# Patient Record
Sex: Female | Born: 2000 | Race: White | Hispanic: No | Marital: Single | Smoking: Never smoker
Health system: Southern US, Community
[De-identification: ages and names within clinical notes are randomized; demographics above are authoritative.]

## PROBLEM LIST (undated history)

## (undated) DIAGNOSIS — J309 Allergic rhinitis, unspecified: Secondary | ICD-10-CM

## (undated) HISTORY — PX: EXTERNAL EAR SURGERY: SHX627

## (undated) HISTORY — DX: Allergic rhinitis, unspecified: J30.9

---
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
## 2022-08-19 ENCOUNTER — Other Ambulatory Visit: Payer: Self-pay | Admitting: Gastroenterology

## 2022-09-11 ENCOUNTER — Other Ambulatory Visit: Payer: Self-pay | Admitting: Gastroenterology

## 2023-01-21 ENCOUNTER — Other Ambulatory Visit: Payer: Self-pay | Admitting: Gastroenterology

## 2023-02-04 ENCOUNTER — Ambulatory Visit: Payer: BC Managed Care – PPO | Admitting: Allergy & Immunology

## 2023-02-04 ENCOUNTER — Encounter: Payer: Self-pay | Admitting: Allergy & Immunology

## 2023-02-04 ENCOUNTER — Other Ambulatory Visit: Payer: Self-pay

## 2023-02-04 VITALS — BP 110/80 | HR 66 | Temp 98.4°F | Resp 16 | Ht 68.31 in | Wt 154.8 lb

## 2023-02-04 DIAGNOSIS — J3089 Other allergic rhinitis: Secondary | ICD-10-CM | POA: Diagnosis not present

## 2023-02-04 DIAGNOSIS — R1084 Generalized abdominal pain: Secondary | ICD-10-CM | POA: Diagnosis not present

## 2023-02-04 DIAGNOSIS — K219 Gastro-esophageal reflux disease without esophagitis: Secondary | ICD-10-CM

## 2023-02-04 DIAGNOSIS — L2089 Other atopic dermatitis: Secondary | ICD-10-CM

## 2023-02-04 DIAGNOSIS — J302 Other seasonal allergic rhinitis: Secondary | ICD-10-CM

## 2023-02-04 NOTE — Patient Instructions (Addendum)
1. Seasonal and perennial allergic rhinitis (grasses, trees and cat) - Continue with Claritin daily as you are doing.  - We can do allergy testing via the blood today to see if anything new pops up.   2. Flexural atopic dermatitis - Continue with moisturizing as you are doing. - It seems that they are under good control.   3. Generalized abdominal pain (with possible food allergies) - We will check for ginger, watermelon, and egg.   4. Return in about 1 year (around 02/04/2024). You can have the follow up appointment with Dr. Dellis Anes or a Nurse Practicioner (our Nurse Practitioners are excellent and always have Physician oversight!).    Please inform us of any Emergency Department visits, hospitalizations, or changes in symptoms. Call us before going to the ED for breathing or allergy symptoms since we might be able to fit you in for a sick visit. Feel free to contact us anytime with any questions, problems, or concerns.  It was a pleasure to see you guys again today!  Websites that have reliable patient information: 1. American Academy of Asthma, Allergy, and Immunology: www.aaaai.org 2. Food Allergy Research and Education (FARE): foodallergy.org 3. Mothers of Asthmatics: http://www.asthmacommunitynetwork.org 4. American College of Allergy, Asthma, and Immunology: www.acaai.org      "Like" [place] on Facebook and Instagram for our latest updates!      A healthy democracy works best when [employer] participate! Make sure you are registered to vote! If you have moved or changed any of your contact information, you will need to get this updated before voting! Scan the QR codes below to learn more!

## 2023-02-04 NOTE — Progress Notes (Signed)
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 Seasonal and perennial allergic rhinitis (grasses, trees and cat) - Continue with Claritin daily as you are doing.  - We can do allergy testing via the blood today to see if anything new pops up.   2. Flexural atopic dermatitis - Continue with moisturizing as you are doing. - It seems that they are under good control.   3. Generalized abdominal pain (with possible food allergies) - We will check for ginger, watermelon, and egg.   4. Return in about 1 year (around 02/04/2024). You can have the follow up appointment with Dr. Dellis Anes or a Nurse Practicioner (our Nurse Practitioners are excellent and always have Physician oversight!).   This note in its entirety was forwarded to the Provider who requested this consultation.  Subjective:   Julia Weber is a 22 y.o. female presenting today for evaluation of  Chief Complaint  Patient presents with   Follow-up    Want to be retesting for allergies, but patient has been an antihistamines.     Julia Weber has a history of the following: Patient Active Problem List   Diagnosis Date Noted   Seasonal and perennial allergic rhinitis 08/05/2019   Insect sting allergy 08/05/2019   Flexural atopic dermatitis 08/05/2019   Food intolerance 08/05/2019    History obtained from: chart review and patient and mother.  Discussed the use of AI scribe software for clinical note transcription with the patient and/or guardian, who gave verbal consent to proceed.  Julia Weber was  referred by Pa, [facility].     Julia Weber is a 22 y.o. female presenting for an evaluation of food and environmental allergens . She was last seen in June 2021. At that time, she had testing that was positive to grasses, trees, and cat. She also had some food testing that was positive to ginger and watermelon, but she was tolerating these without a problem. I felt that these were sensitizations instead. She also had a stinging insect panel ordered that was negative. Egg testing was essentially negative with an IgE of 0.11 to ovalbumin.   Since the last visit, she has generally done well. She is home from school and they wanted to come in to get re-established.   Allergic Rhinitis Symptom History: Julia Weber has a history of environmental allergies, which are reportedly well-controlled but tend to exacerbate in the fall and spring. She had a recent episode of sinus issues, for which she was prescribed antibiotics. She was also advised to switch from Claritin to Xyzal, but she developed hoarseness, which she attributed to the new medication, and subsequently reverted back to Claritin.  Food Allergy Symptom History: Julia Weber, with a known history of food allergies, specifically to watermelon, ginger, and eggs, presents for retesting after a period of three and a half years. She has been strictly avoiding these allergens since her last visit and has not experienced any adverse reactions. The patient also reports a history of stomach pain associated with certain foods, which has improved since avoiding the identified allergens.  She has been managing any residual stomach discomfort with dicyclomine, which she reports as effective. She has IBS which was diagnosed after we saw her in 2021.   Skin Symptom History: Julia Weber has a history of eczema, which was previously mild and is currently well-controlled.   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 She is considering transferring to a different school due to dissatisfaction with her current situation.  Otherwise, there is no history of other atopic diseases, including asthma, food allergies, drug allergies, stinging insect allergies, or contact dermatitis. There is no significant infectious history. Vaccinations are up to date.    Past Medical History: Patient Active Problem List   Diagnosis Date Noted   Seasonal and perennial allergic rhinitis 08/05/2019   Insect sting allergy 08/05/2019   Flexural atopic dermatitis 08/05/2019   Food intolerance 08/05/2019    Medication List:  Allergies as of 02/04/2023       Reactions   Ginger    Watermelon [citrullus Vulgaris]    Per allergist office note        Medication List        Accurate as of February 04, 2023  1:06 PM. If you have any questions, ask your nurse or doctor.          STOP taking these medications    IBgard 90 MG Cpcr Generic drug: Peppermint Oil Stopped by: Alfonse Spruce   ondansetron 4 MG disintegrating tablet Commonly known as: ZOFRAN-ODT Stopped by: Alfonse Spruce       TAKE these medications    clindamycin 1 % gel Commonly known as: CLINDAGEL Apply 1 Application topically as directed.   dicyclomine 10 MG capsule Commonly known as: BENTYL Take 1 capsule (10 mg total) by mouth every 8 (eight) hours as needed for spasms. Please schedule a yearly follow up for further refills. Thank you   loratadine 10 MG tablet Commonly known as: CLARITIN Take 10 mg by mouth daily.   norgestimate-ethinyl estradiol 0.25-35 MG-MCG tablet Commonly known as: ORTHO-CYCLEN Take 1 tablet by mouth daily.   omeprazole 20 MG capsule Commonly known as: PRILOSEC Take 1 capsule by mouth daily.   tretinoin 0.05 % cream Commonly known as: RETIN-A Apply 1 Application topically as needed.   triamcinolone 55 MCG/ACT Aero nasal inhaler Commonly known as: NASACORT Place into  the nose.        Birth History: non-contributory  Developmental History: non-contributory  Past Surgical History: Past Surgical History:  Procedure Laterality Date   EXTERNAL EAR SURGERY       Family History: Family History  Problem Relation Age of Onset   Allergic rhinitis Mother    Cancer Maternal Grandmother        mouth   Colon cancer Paternal Grandmother    Stomach cancer Neg Hx    Esophageal cancer Neg Hx      Social History: Victora lives at home with her mother. They live in a house that is 32 years old. There is carpeting throughout the home. She has gas heating and electric and central cooling. 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 There are no dust mite coverings in the home. They have no cigarette smoking exposure. They do have a HEPA filter in the home. They do not live near an interstate or industrial area.    Review of systems otherwise negative other than that mentioned in the HPI.    Objective:   Blood pressure 110/80, pulse  66, temperature 98.4 F (36.9 C), temperature source Temporal, resp. rate 16, height 5' 8.31" (1.735 m), weight 154 lb 12.8 oz (70.2 kg), SpO2 100%. Body mass index is 23.33 kg/m.     Physical Exam Vitals reviewed.  Constitutional:      Appearance: She is well-developed.     Comments: Very friendly.   HENT:     Head: Normocephalic and atraumatic.     Right Ear: Tympanic membrane, ear canal and external ear normal. No drainage, swelling or tenderness. Tympanic membrane is not injected, scarred, erythematous, retracted or bulging.     Left Ear: Tympanic membrane, ear canal and external ear normal. No drainage, swelling or tenderness. Tympanic membrane is not injected, scarred, erythematous, retracted or bulging.     Nose: No nasal deformity, septal deviation, mucosal edema or rhinorrhea.     Right Turbinates: Enlarged, swollen and pale.     Left Turbinates: Enlarged, swollen and pale.     Right Sinus: No maxillary sinus tenderness  or frontal sinus tenderness.     Left Sinus: No maxillary sinus tenderness or frontal sinus tenderness.     Mouth/Throat:     Mouth: Mucous membranes are not pale and not dry.     Pharynx: Uvula midline.  Eyes:     General:        Right eye: No discharge.        Left eye: No discharge.     Conjunctiva/sclera: Conjunctivae normal.     Right eye: Right conjunctiva is not injected. No chemosis.    Left eye: Left conjunctiva is not injected. No chemosis.    Pupils: Pupils are equal, round, and reactive to light.  Cardiovascular:     Rate and Rhythm: Normal rate and regular rhythm.     Heart sounds: Normal heart sounds.  Pulmonary:     Effort: Pulmonary effort is normal. No tachypnea, accessory muscle usage or respiratory distress.     Breath sounds: Normal breath sounds. No wheezing, rhonchi or rales.     Comments: Moving air well in all lung fields. No increased work of breathing noted.  Chest:     Chest wall: No tenderness.  Abdominal:     Tenderness: There is no abdominal tenderness. There is no guarding or rebound.  Lymphadenopathy:     Head:     Right side of head: No submandibular, tonsillar or occipital adenopathy.     Left side of head: No submandibular, tonsillar or occipital adenopathy.     Cervical: No cervical adenopathy.  Skin:    Coloration: Skin is not pale.     Findings: No abrasion, erythema, petechiae or rash. Rash is not papular, urticarial or vesicular.  Neurological:     Mental Status: She is alert.  Psychiatric:        Behavior: Behavior is cooperative.      Diagnostic studies: labs sent instead       Malachi Bonds, MD [facility]

## 2023-02-06 LAB — ALLERGENS W/COMP RFLX AREA 2
Alternaria Alternata IgE: <0.1 kU/L
Aspergillus Fumigatus IgE: <0.1 kU/L
Bermuda Grass IgE: <0.1 kU/L
Cedar, Mountain IgE: 0.57 kU/L — AB
Cladosporium Herbarum IgE: 0.18 kU/L — AB
Cockroach, German IgE: <0.1 kU/L
Common Silver Birch IgE: <0.1 kU/L
Cottonwood IgE: <0.1 kU/L
D Farinae IgE: 0.75 kU/L — AB
D Pteronyssinus IgE: 0.51 kU/L — AB
E001-IgE Cat Dander: 0.33 kU/L — AB
E005-IgE Dog Dander: 0.12 kU/L — AB
Elm, American IgE: <0.1 kU/L
IgE (Immunoglobulin E), Serum: 242 [IU]/mL (ref 6–495)
Johnson Grass IgE: <0.1 kU/L
Maple/Box Elder IgE: <0.1 kU/L
Mouse Urine IgE: <0.1 kU/L
Oak, White IgE: 0.1 kU/L — AB
Pecan, Hickory IgE: <0.1 kU/L
Penicillium Chrysogen IgE: <0.1 kU/L
Pigweed, Rough IgE: 0.15 kU/L — AB
Ragweed, Short IgE: <0.1 kU/L
Sheep Sorrel IgE Qn: <0.1 kU/L
Timothy Grass IgE: 0.16 kU/L — AB
White Mulberry IgE: <0.1 kU/L

## 2023-02-06 LAB — EGG COMPONENT PANEL
F232-IgE Ovalbumin: <0.1 kU/L
F233-IgE Ovomucoid: <0.1 kU/L

## 2023-02-06 LAB — ALLERGEN WATERMELON: Allergen Watermelon IgE: 0.12 kU/L — AB

## 2023-02-06 LAB — ALLERGEN, GINGER, RF270: Allergen Ginger IgE: 0.12 kU/L — AB

## 2023-02-07 ENCOUNTER — Telehealth: Payer: Self-pay | Admitting: Allergy & Immunology

## 2023-02-07 NOTE — Telephone Encounter (Signed)
I called Amy patient's mom but informed an updated DPR is not on file yet and I cannot disclose information to her. Mom then gave me patient's number [phone]. I called patient and informed results have came back but the provider has not reviewed yet. I informed once provider results labs we will call or a mychart message will be sent.

## 2023-02-07 NOTE — Telephone Encounter (Signed)
Patient said she had lab work done and was calling for results.

## 2023-04-24 ENCOUNTER — Other Ambulatory Visit: Payer: Self-pay | Admitting: Gastroenterology

## 2023-05-03 ENCOUNTER — Other Ambulatory Visit: Payer: Self-pay | Admitting: Gastroenterology

## 2024-02-03 ENCOUNTER — Ambulatory Visit: Payer: BC Managed Care – PPO | Admitting: Allergy & Immunology
